# Patient Record
Sex: Female | Born: 1965 | Hispanic: No | Marital: Married | Smoking: Never smoker
Health system: Southern US, Community
[De-identification: ages and names within clinical notes are randomized; demographics above are authoritative.]

## PROBLEM LIST (undated history)

## (undated) DIAGNOSIS — D649 Anemia, unspecified: Secondary | ICD-10-CM

## (undated) DIAGNOSIS — F32A Depression, unspecified: Secondary | ICD-10-CM

## (undated) DIAGNOSIS — M1990 Unspecified osteoarthritis, unspecified site: Secondary | ICD-10-CM

## (undated) DIAGNOSIS — Z87442 Personal history of urinary calculi: Secondary | ICD-10-CM

## (undated) DIAGNOSIS — J45909 Unspecified asthma, uncomplicated: Secondary | ICD-10-CM

## (undated) DIAGNOSIS — R519 Headache, unspecified: Secondary | ICD-10-CM

## (undated) DIAGNOSIS — Z8719 Personal history of other diseases of the digestive system: Secondary | ICD-10-CM

## (undated) DIAGNOSIS — K219 Gastro-esophageal reflux disease without esophagitis: Secondary | ICD-10-CM

---
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
## 1994-06-01 HISTORY — PX: TUBAL LIGATION: SHX77

## 1999-06-02 HISTORY — PX: BUNIONECTOMY: SHX129

## 1999-06-02 HISTORY — PX: ABDOMINAL HYSTERECTOMY: SHX81

## 2004-06-01 HISTORY — PX: BUNIONECTOMY: SHX129

## 2008-06-01 HISTORY — PX: ROUX-EN-Y GASTRIC BYPASS: SHX1104

## 2013-11-30 ENCOUNTER — Other Ambulatory Visit: Payer: Self-pay | Admitting: Orthopedic Surgery

## 2013-11-30 DIAGNOSIS — M79604 Pain in right leg: Secondary | ICD-10-CM

## 2013-12-05 ENCOUNTER — Other Ambulatory Visit: Payer: Self-pay

## 2013-12-05 ENCOUNTER — Ambulatory Visit
Admission: RE | Admit: 2013-12-05 | Discharge: 2013-12-05 | Disposition: A | Discharge status: Home / Self Care | Payer: BC Managed Care – PPO | Source: Ambulatory Visit | Attending: Orthopedic Surgery | Admitting: Orthopedic Surgery

## 2013-12-05 DIAGNOSIS — M79604 Pain in right leg: Secondary | ICD-10-CM

## 2015-03-17 IMAGING — US US EXTREM LOW VENOUS*R*
1 series · 13 of 24 positions shown · non-contrast
Comparison: None.

CLINICAL DATA: Lower extremity pain

EXAM:
RIGHT LOWER EXTREMITY VENOUS DUPLEX ULTRASOUND
TECHNIQUE: Gray-scale sonography with graded compression, as well as color
Doppler and duplex ultrasound were performed to evaluate the lower
extremity deep venous systems from the level of the common femoral
vein and including the common femoral, femoral, profunda femoral,
popliteal and calf veins including the posterior tibial, peroneal
and gastrocnemius veins when visible. The superficial great
saphenous vein was also interrogated. Spectral Doppler was utilized
to evaluate flow at rest and with distal augmentation maneuvers in
the common femoral, femoral and popliteal veins.

[Series 1: us extrem low venous*right* · 13 of 34 slices shown]
[im 1/34]
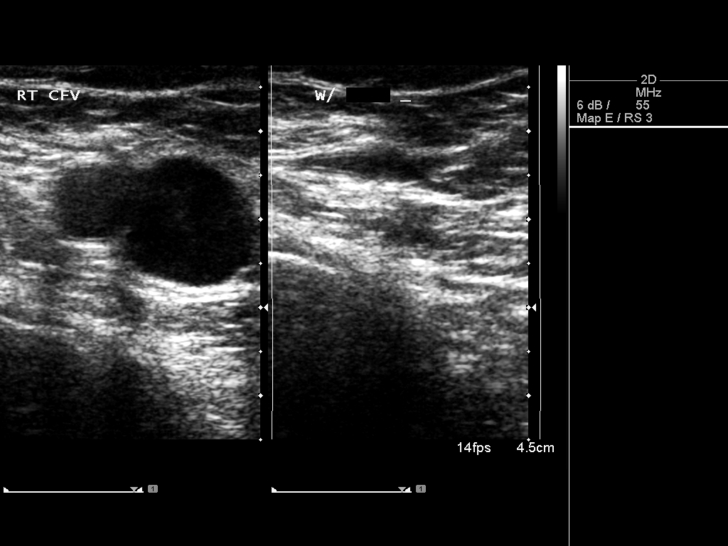
[im 3/34]
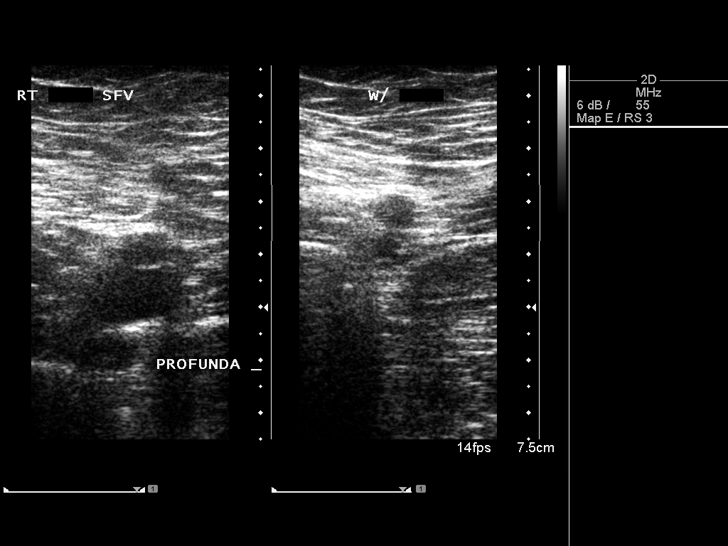
[im 6/34]
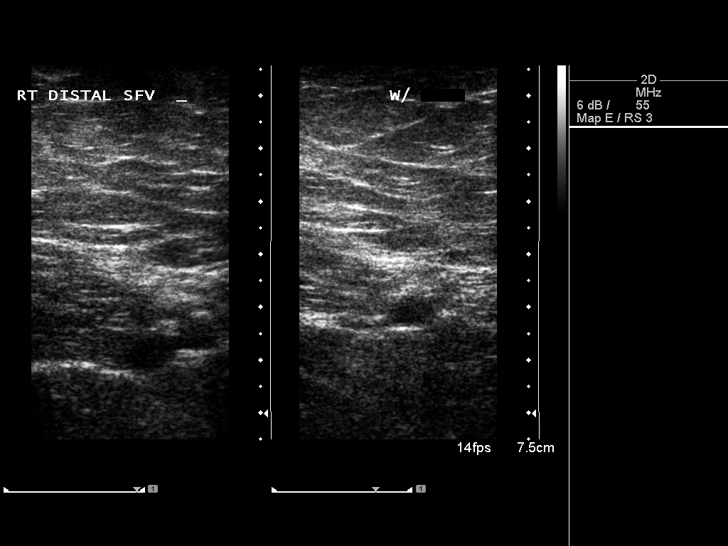
[im 9/34]
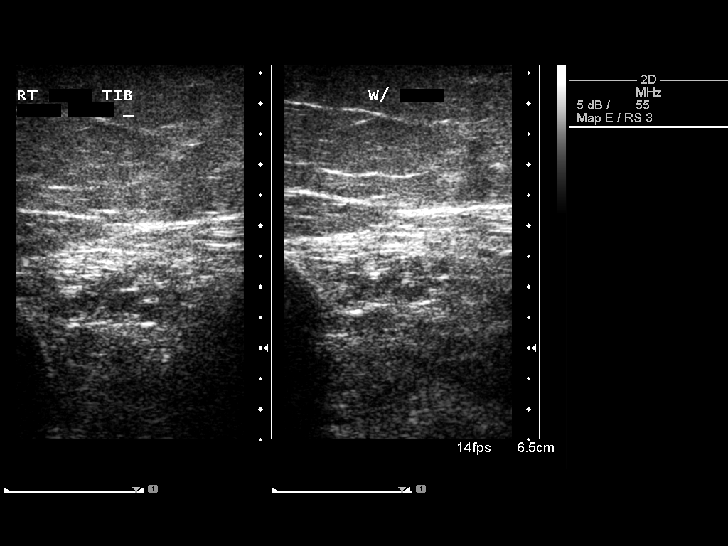
[im 12/34]
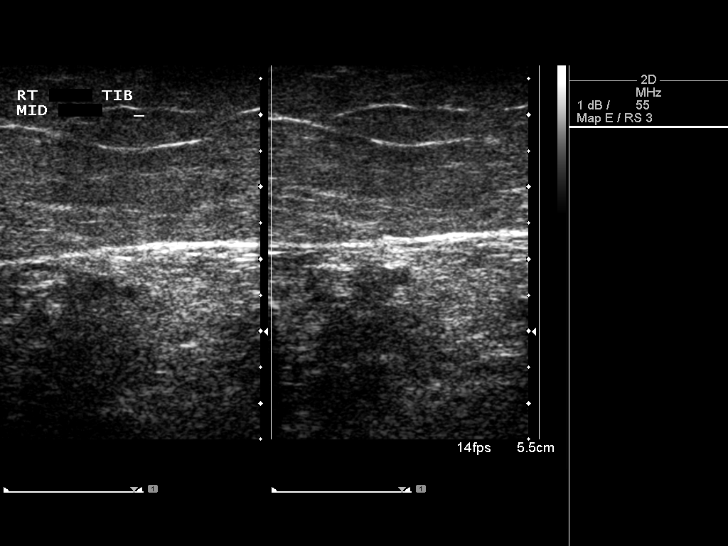
[im 15/34]
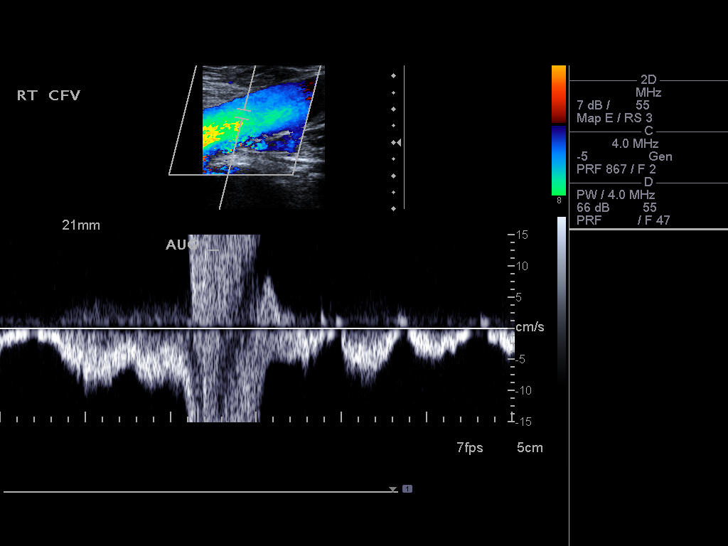
[im 18/34]
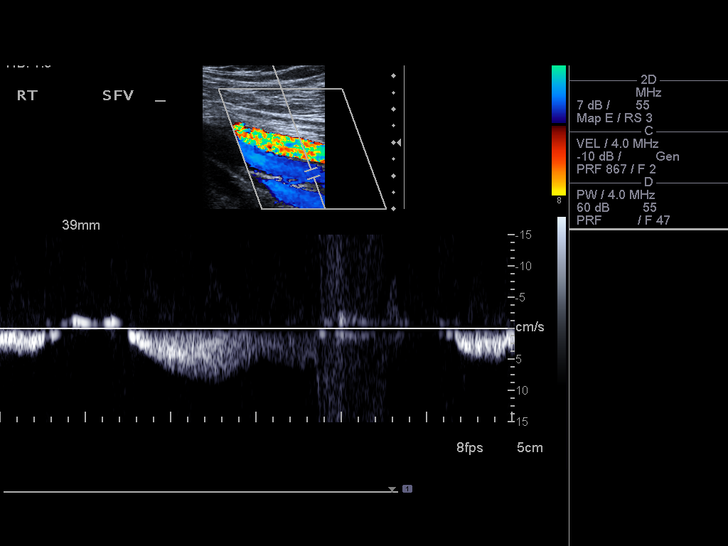
[im 19/34]
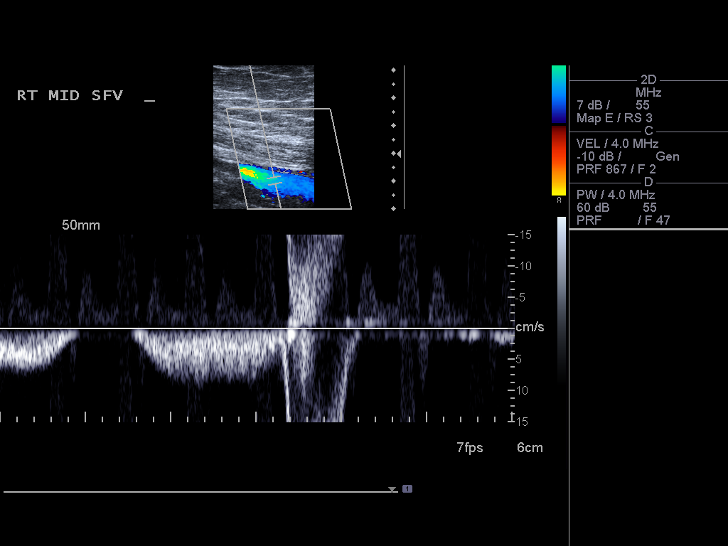
[im 22/34]
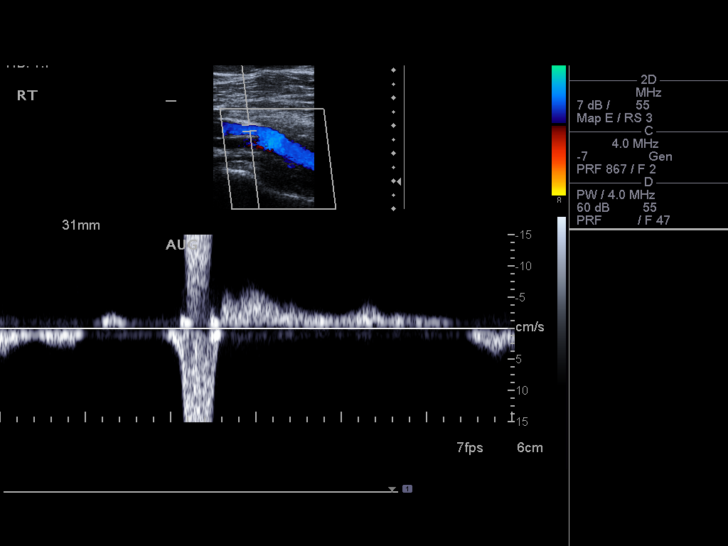
[im 25/34]
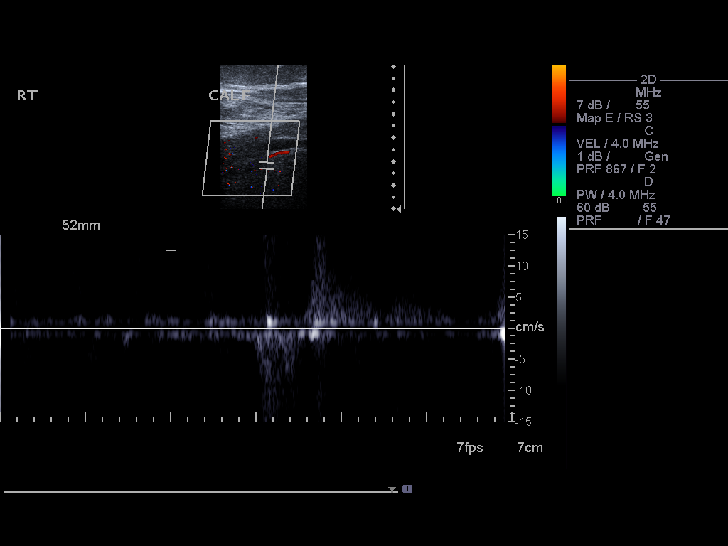
[im 28/34]
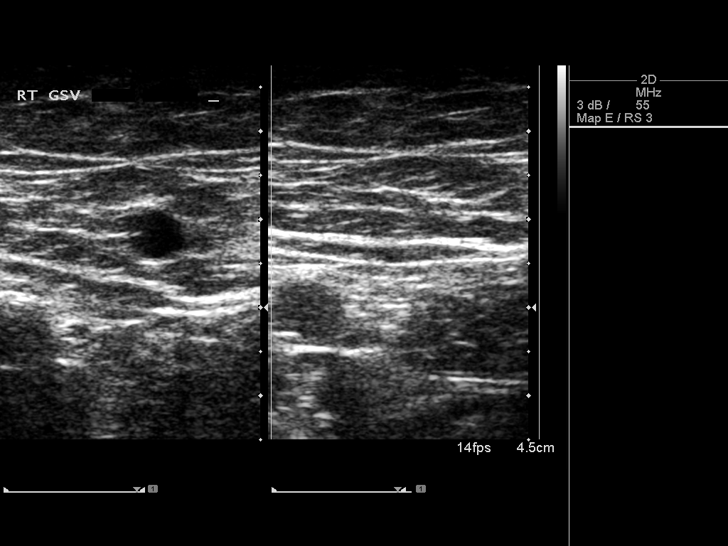
[im 31/34]
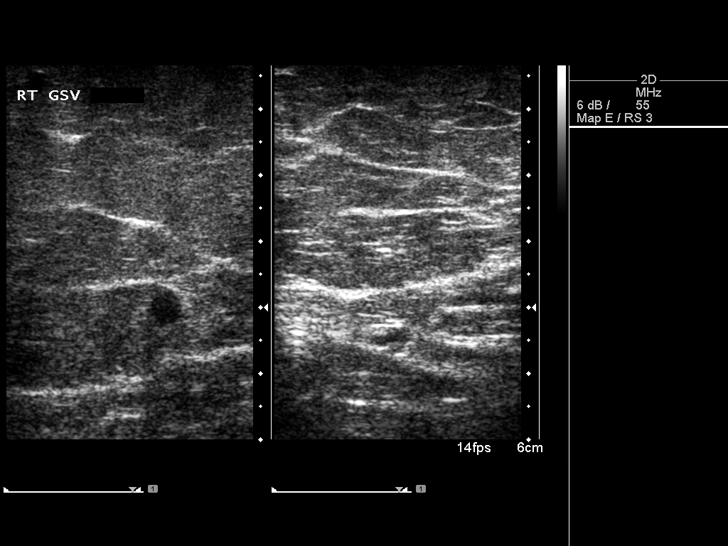
[im 34/34]
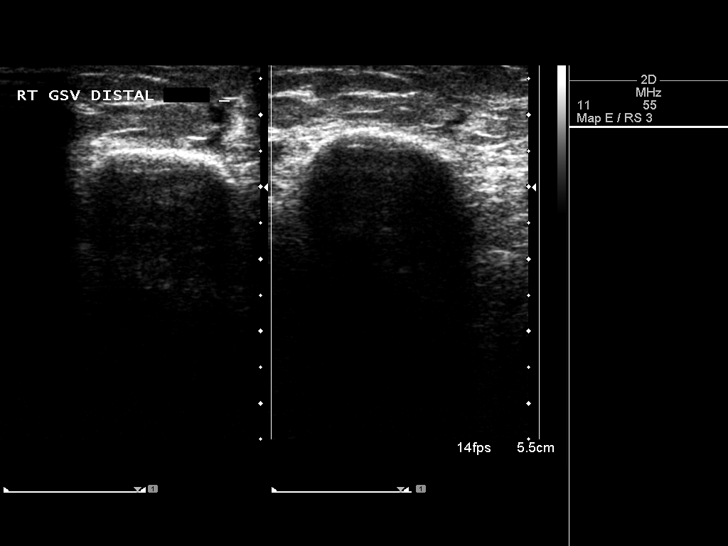

[13 of 24 positions shown; findings below may reference images not displayed]

FINDINGS: Flow in the venous structures of the right lower extremity is
spontaneous and phasic in all segments. There is normal compression
and augmentation in the venous structures of the right lower
extremity. Venous Doppler signal is normal in all regions. There is
no thrombus in the deep or visualized superficial venous structures
on the right. There is no deep venous incompetence on the right.
IMPRESSION: No evidence of right lower extremity deep venous thrombosis.

## 2019-06-02 HISTORY — PX: MENISCUS REPAIR: SHX5179

## 2019-10-02 ENCOUNTER — Encounter: Payer: Self-pay | Admitting: Gastroenterology

## 2019-10-24 ENCOUNTER — Encounter: Payer: Self-pay | Admitting: Gastroenterology

## 2019-11-10 ENCOUNTER — Encounter: Payer: Self-pay | Admitting: Gastroenterology

## 2023-02-04 ENCOUNTER — Other Ambulatory Visit: Payer: Self-pay | Admitting: Orthopedic Surgery

## 2023-02-12 NOTE — Progress Notes (Signed)
COVID Vaccine received:  []  No [x]  Yes Date of any COVID positive Test in last 90 days: no PCP -Stoney Bang PA Cardiologist - no  Chest x-ray - 02/15/23 Epic EKG -  02/15/23 Epic Stress Test - no ECHO - no Cardiac Cath - no  Bowel Prep - [x]  No  []   Yes ______  Pacemaker / ICD device [x]  No []  Yes   Spinal Cord Stimulator:[x]  No []  Yes       History of Sleep Apnea? [x]  No []  Yes   CPAP used?- [x]  No []  Yes    Does the patient monitor blood sugar?          [x]  No []  Yes  []  N/A  Patient has: [x]  NO Hx DM   []  Pre-DM                 []  DM1  []   DM2 Does patient have a [employer] or Dexacom? []  No []  Yes   Fasting Blood Sugar Ranges-  Checks Blood Sugar _____ times a day  GLP1 agonist / usual dose - no GLP1 instructions:  SGLT-2 inhibitors / usual dose - no SGLT-2 instructions:   Blood Thinner / Instructions:No Aspirin Instructions:Allergic  Comments:   Activity level: Patient is able to climb a flight of stairs without difficulty; [x]  No CP  [x]  No SOB, but would have _Knee pain__   Patient can perform ADLs without assistance.   Anesthesia review:   Patient denies shortness of breath, fever, cough and chest pain at PAT appointment.  Patient verbalized understanding and agreement to the Pre-Surgical Instructions that were given to them at this PAT appointment. Patient was also educated of the need to review these PAT instructions again prior to his/her surgery.I reviewed the appropriate phone numbers to call if they have any and questions or concerns.

## 2023-02-12 NOTE — Patient Instructions (Signed)
SURGICAL WAITING ROOM VISITATION  Patients having surgery or a procedure may have no more than 2 support people in the waiting area - these visitors may rotate.    Children under the age of 73 must have an adult with them who is not the patient.  Due to an increase in RSV and influenza rates and associated hospitalizations, children ages 88 and under may not visit patients in [facility] hospitals.  If the patient needs to stay at the hospital during part of their recovery, the visitor guidelines for inpatient rooms apply. Pre-op nurse will coordinate an appropriate time for 1 support person to accompany patient in pre-op.  This support person may not rotate.    Please refer to the [facility] website for the visitor guidelines for Inpatients (after your surgery is over and you are in a regular room).       Your procedure is scheduled on: 02/22/23   Report to [facility] Main Entrance    Report to admitting at  9:50 AM   Call this number if you have problems the morning of surgery [phone]   Do not eat food :After Midnight.   After Midnight you may have the following liquids until 9:20 AM DAY OF SURGERY  Water Non-Citrus Juices (without pulp, NO RED-Apple, White grape, White cranberry) Black Coffee (NO MILK/CREAM OR CREAMERS, sugar ok)  Clear Tea (NO MILK/CREAM OR CREAMERS, sugar ok) regular and decaf                             Plain Jell-O (NO RED)                                           Fruit ices (not with fruit pulp, NO RED)                                     Popsicles (NO RED)                                                               Sports drinks like Gatorade (NO RED)                  The day of surgery:  Drink ONE (1) Pre-Surgery Clear Ensure at 9:20 AM the morning of surgery. Drink in one sitting. Do not sip.  This drink was given to you during your hospital  pre-op appointment visit. Nothing else to drink after completing the  Pre-Surgery Clear  Ensure      Oral Hygiene is also important to reduce your risk of infection.                                    Remember - BRUSH YOUR TEETH THE MORNING OF SURGERY WITH YOUR REGULAR TOOTHPASTE   Stop all vitamins and herbal supplements 7 days before surgery.   Take these medicines the morning of surgery : Loratidine, tylenol if needed.  You may not have any metal on your body including hair pins, jewelry, and body piercing             Do not wear make-up, lotions, powders, perfumes or deodorant  Do not wear nail polish including gel and S&S, artificial/acrylic nails, or any other type of covering on natural nails including finger and toenails. If you have artificial nails, gel coating, etc. that needs to be removed by a nail salon please have this removed prior to surgery or surgery may need to be canceled/ delayed if the surgeon/ anesthesia feels like they are unable to be safely monitored.   Do not shave  48 hours prior to surgery.    Do not bring valuables to the hospital. [facility] IS NOT             RESPONSIBLE   FOR VALUABLES.   Contacts, glasses, dentures or bridgework may not be worn into surgery.  DO NOT BRING YOUR HOME MEDICATIONS TO THE HOSPITAL. PHARMACY WILL DISPENSE MEDICATIONS LISTED ON YOUR MEDICATION LIST TO YOU DURING YOUR ADMISSION IN THE HOSPITAL!    Patients discharged on the day of surgery will not be allowed to drive home.  Someone NEEDS to stay with you for the first 24 hours after anesthesia.   Special Instructions: Bring a copy of your healthcare power of attorney and living will documents the day of surgery if you haven't scanned them before.              Please read over the following fact sheets you were given: IF YOU HAVE QUESTIONS ABOUT YOUR PRE-OP INSTRUCTIONS PLEASE CALL [phone] Rosey [facility]   If you received a COVID test during your pre-op visit  it is requested that you wear a mask when out in public, stay away from anyone that may not be  feeling well and notify your surgeon if you develop symptoms. If you test positive for Covid or have been in contact with anyone that has tested positive in the last 10 days please notify you surgeon.      Pre-operative 5 CHG Bath Instructions   You can play a key role in reducing the risk of infection after surgery. Your skin needs to be as free of germs as possible. You can reduce the number of germs on your skin by washing with CHG (chlorhexidine gluconate) soap before surgery. CHG is an antiseptic soap that kills germs and continues to kill germs even after washing.   DO NOT use if you have an allergy to chlorhexidine/CHG or antibacterial soaps. If your skin becomes reddened or irritated, stop using the CHG and notify one of our RNs at [phone].   Please shower with the CHG soap starting 4 days before surgery using the following schedule:     Please keep in mind the following:  DO NOT shave, including legs and underarms, starting the day of your first shower.   You may shave your face at any point before/day of surgery.  Place clean sheets on your bed the day you start using CHG soap. Use a clean washcloth (not used since being washed) for each shower. DO NOT sleep with pets once you start using the CHG.   CHG Shower Instructions:  If you choose to wash your hair and private area, wash first with your normal shampoo/soap.  After you use shampoo/soap, rinse your hair and body thoroughly to remove shampoo/soap residue.  Turn the water OFF and apply about 3 tablespoons (45 ml) of  CHG soap to a CLEAN washcloth.  Apply CHG soap ONLY FROM YOUR NECK DOWN TO YOUR TOES (washing for 3-5 minutes)  DO NOT use CHG soap on face, private areas, open wounds, or sores.  Pay special attention to the area where your surgery is being performed.  If you are having back surgery, having someone wash your back for you may be helpful. Wait 2 minutes after CHG soap is applied, then you may rinse off the  CHG soap.  Pat dry with a clean towel  Put on clean clothes/pajamas   If you choose to wear lotion, please use ONLY the CHG-compatible lotions on the back of this paper.     Additional instructions for the day of surgery: DO NOT APPLY any lotions, deodorants, cologne, or perfumes.   Put on clean/comfortable clothes.  Brush your teeth.  Ask your nurse before applying any prescription medications to the skin.      CHG Compatible Lotions   Aveeno Moisturizing lotion  Cetaphil Moisturizing Cream  Cetaphil Moisturizing Lotion  Clairol Herbal Essence Moisturizing Lotion, Dry Skin  Clairol Herbal Essence Moisturizing Lotion, Extra Dry Skin  Clairol Herbal Essence Moisturizing Lotion, Normal Skin  Curel Age Defying Therapeutic Moisturizing Lotion with Alpha Hydroxy  Curel Extreme Care Body Lotion  Curel Soothing Hands Moisturizing Hand Lotion  Curel Therapeutic Moisturizing Cream, Fragrance-Free  Curel Therapeutic Moisturizing Lotion, Fragrance-Free  Curel Therapeutic Moisturizing Lotion, Original Formula  Eucerin Daily Replenishing Lotion  Eucerin Dry Skin Therapy Plus Alpha Hydroxy Crme  Eucerin Dry Skin Therapy Plus Alpha Hydroxy Lotion  Eucerin Original Crme  Eucerin Original Lotion  Eucerin Plus Crme Eucerin Plus Lotion  Eucerin TriLipid Replenishing Lotion  Keri Anti-Bacterial Hand Lotion  Keri Deep Conditioning Original Lotion Dry Skin Formula Softly Scented  Keri Deep Conditioning Original Lotion, Fragrance Free Sensitive Skin Formula  Keri Lotion Fast Absorbing Fragrance Free Sensitive Skin Formula  Keri Lotion Fast Absorbing Softly Scented Dry Skin Formula  Keri Original Lotion  Keri Skin Renewal Lotion Keri Silky Smooth Lotion  Keri Silky Smooth Sensitive Skin Lotion  Nivea Body Creamy Conditioning Oil  Nivea Body Extra Enriched Lotion  Nivea Body Original Lotion  Nivea Body Sheer Moisturizing Lotion Nivea Crme  Nivea Skin Firming Lotion  NutraDerm 30 Skin  Lotion  NutraDerm Skin Lotion  NutraDerm Therapeutic Skin Cream  NutraDerm Therapeutic Skin Lotion  ProShield Protective Hand Cream    Incentive Spirometer (Watch this video at home: [email or web address])  An incentive spirometer is a tool that can help keep your lungs clear and active. This tool measures how well you are filling your lungs with each breath. Taking long deep breaths may help reverse or decrease the chance of developing breathing (pulmonary) problems (especially infection) following: A long period of time when you are unable to move or be active. BEFORE THE PROCEDURE  If the spirometer includes an indicator to show your best effort, your nurse or respiratory therapist will set it to a desired goal. If possible, sit up straight or lean slightly forward. Try not to slouch. Hold the incentive spirometer in an upright position. INSTRUCTIONS FOR USE  Sit on the edge of your bed if possible, or sit up as far as you can in bed or on a chair. Hold the incentive spirometer in an upright position. Breathe out normally. Place the mouthpiece in your mouth and seal your lips tightly around it. Breathe in slowly and as deeply as possible, raising the piston or the ball toward the top of  the column. Hold your breath for 3-5 seconds or for as long as possible. Allow the piston or ball to fall to the bottom of the column. Remove the mouthpiece from your mouth and breathe out normally. Rest for a few seconds and repeat Steps 1 through 7 at least 10 times every 1-2 hours when you are awake. Take your time and take a few normal breaths between deep breaths. The spirometer may include an indicator to show your best effort. Use the indicator as a goal to work toward during each repetition. After each set of 10 deep breaths, practice coughing to be sure your lungs are clear. If you have an incision (the cut made at the time of surgery), support your incision when coughing by  placing a pillow or rolled up towels firmly against it. Once you are able to get out of bed, walk around indoors and cough well. You may stop using the incentive spirometer when instructed by your caregiver.  RISKS AND COMPLICATIONS Take your time so you do not get dizzy or light-headed. If you are in pain, you may need to take or ask for pain medication before doing incentive spirometry. It is harder to take a deep breath if you are having pain. AFTER USE Rest and breathe slowly and easily. It can be helpful to keep track of a log of your progress. Your caregiver can provide you with a simple table to help with this. If you are using the spirometer at home, follow these instructions: SEEK MEDICAL CARE IF:  You are having difficultly using the spirometer. You have trouble using the spirometer as often as instructed. Your pain medication is not giving enough relief while using the spirometer. You develop fever of 100.5 F (38.1 C) or higher. SEEK IMMEDIATE MEDICAL CARE IF:  You cough up bloody sputum that had not been present before. You develop fever of 102 F (38.9 C) or greater. You develop worsening pain at or near the incision site. MAKE SURE YOU:  Understand these instructions. Will watch your condition. Will get help right away if you are not doing well or get worse. Document Released: 09/28/2006 Document Revised: 08/10/2011 Document Reviewed: 11/29/2006 [facility] Patient Information 2014 [place], [place].

## 2023-02-15 ENCOUNTER — Encounter (HOSPITAL_COMMUNITY)
Admission: RE | Admit: 2023-02-15 | Discharge: 2023-02-15 | Disposition: A | Discharge status: Home / Self Care | Payer: No Typology Code available for payment source | Source: Ambulatory Visit | Attending: Orthopedic Surgery | Admitting: Orthopedic Surgery

## 2023-02-15 ENCOUNTER — Other Ambulatory Visit: Payer: Self-pay

## 2023-02-15 ENCOUNTER — Encounter (HOSPITAL_COMMUNITY): Payer: Self-pay | Admitting: *Deleted

## 2023-02-15 ENCOUNTER — Ambulatory Visit (HOSPITAL_COMMUNITY)
Admission: RE | Admit: 2023-02-15 | Discharge: 2023-02-15 | Disposition: A | Discharge status: Home / Self Care | Payer: No Typology Code available for payment source | Source: Ambulatory Visit | Attending: Orthopedic Surgery | Admitting: Orthopedic Surgery

## 2023-02-15 VITALS — HR 79 | Temp 98.1°F | Resp 16 | Ht 61.5 in | Wt 192.0 lb

## 2023-02-15 DIAGNOSIS — D649 Anemia, unspecified: Secondary | ICD-10-CM | POA: Diagnosis not present

## 2023-02-15 DIAGNOSIS — Z01818 Encounter for other preprocedural examination: Secondary | ICD-10-CM | POA: Insufficient documentation

## 2023-02-15 HISTORY — DX: Depression, unspecified: F32.A

## 2023-02-15 HISTORY — DX: Personal history of other diseases of the digestive system: Z87.19

## 2023-02-15 HISTORY — DX: Personal history of urinary calculi: Z87.442

## 2023-02-15 HISTORY — DX: Gastro-esophageal reflux disease without esophagitis: K21.9

## 2023-02-15 HISTORY — DX: Anemia, unspecified: D64.9

## 2023-02-15 HISTORY — DX: Unspecified asthma, uncomplicated: J45.909

## 2023-02-15 HISTORY — DX: Unspecified osteoarthritis, unspecified site: M19.90

## 2023-02-15 HISTORY — DX: Headache, unspecified: R51.9

## 2023-02-15 LAB — CBC
HCT: 42.5 % (ref 36.0–46.0)
Hemoglobin: 13 g/dL (ref 12.0–15.0)
MCH: 26.7 pg (ref 26.0–34.0)
MCHC: 30.6 g/dL (ref 30.0–36.0)
MCV: 87.3 fL (ref 80.0–100.0)
Platelets: 261 10*3/uL (ref 150–400)
RBC: 4.87 MIL/uL (ref 3.87–5.11)
RDW: 14.7 % (ref 11.5–15.5)
WBC: 8.1 10*3/uL (ref 4.0–10.5)
nRBC: 0 % (ref 0.0–0.2)

## 2023-02-15 LAB — BASIC METABOLIC PANEL
Anion gap: 9 (ref 5–15)
BUN: 11 mg/dL (ref 6–20)
CO2: 26 mmol/L (ref 22–32)
Calcium: 9.1 mg/dL (ref 8.9–10.3)
Chloride: 103 mmol/L (ref 98–111)
Creatinine, Ser: 0.57 mg/dL (ref 0.44–1.00)
GFR, Estimated: >60 mL/min (ref >60)
Glucose, Bld: 92 mg/dL (ref 70–99)
Potassium: 4.1 mmol/L (ref 3.5–5.1)
Sodium: 138 mmol/L (ref 135–145)

## 2023-02-16 LAB — SURGICAL PCR SCREEN

## 2023-02-22 ENCOUNTER — Encounter (HOSPITAL_COMMUNITY)
Admission: RE | Disposition: A | Discharge status: Home / Self Care | Payer: Self-pay | Source: Home / Self Care | Attending: Orthopedic Surgery

## 2023-02-22 ENCOUNTER — Encounter (HOSPITAL_COMMUNITY): Payer: Self-pay | Admitting: Orthopedic Surgery

## 2023-02-22 ENCOUNTER — Ambulatory Visit (HOSPITAL_COMMUNITY)
Admission: RE | Admit: 2023-02-22 | Discharge: 2023-02-22 | Disposition: A | Discharge status: Home / Self Care | Payer: No Typology Code available for payment source | Attending: Orthopedic Surgery | Admitting: Orthopedic Surgery

## 2023-02-22 ENCOUNTER — Ambulatory Visit (HOSPITAL_COMMUNITY): Payer: No Typology Code available for payment source | Admitting: Anesthesiology

## 2023-02-22 ENCOUNTER — Other Ambulatory Visit: Payer: Self-pay

## 2023-02-22 DIAGNOSIS — M25761 Osteophyte, right knee: Secondary | ICD-10-CM | POA: Diagnosis not present

## 2023-02-22 DIAGNOSIS — M1711 Unilateral primary osteoarthritis, right knee: Secondary | ICD-10-CM | POA: Insufficient documentation

## 2023-02-22 DIAGNOSIS — Z01818 Encounter for other preprocedural examination: Secondary | ICD-10-CM

## 2023-02-22 HISTORY — PX: TOTAL KNEE ARTHROPLASTY: SHX125

## 2023-02-22 LAB — SURGICAL PCR SCREEN
MRSA, PCR: NEGATIVE
Staphylococcus aureus: NEGATIVE

## 2023-02-22 SURGERY — ARTHROPLASTY, KNEE, TOTAL
Anesthesia: Spinal | Site: Knee | Laterality: Right

## 2023-02-22 MED ORDER — BUPIVACAINE-EPINEPHRINE (PF) 0.5% -1:200000 IJ SOLN
INTRAMUSCULAR | Status: AC
  Filled 2023-02-22: qty 30

## 2023-02-22 MED ORDER — LIDOCAINE HCL (PF) 2 % IJ SOLN
INTRAMUSCULAR | Status: AC
  Filled 2023-02-22: qty 10

## 2023-02-22 MED ORDER — CEFAZOLIN SODIUM-DEXTROSE 2-4 GM/100ML-% IV SOLN: 2.0000 g | Freq: Once | INTRAVENOUS | Status: DC

## 2023-02-22 MED ORDER — MIDAZOLAM HCL 2 MG/2ML IJ SOLN
1.0000 mg | INTRAMUSCULAR | Status: DC
  Administered 2023-02-22: 1 mg via INTRAVENOUS
  Filled 2023-02-22: qty 2

## 2023-02-22 MED ORDER — BUPIVACAINE LIPOSOME 1.3 % IJ SUSP
INTRAMUSCULAR | Status: AC
  Filled 2023-02-22: qty 20

## 2023-02-22 MED ORDER — SODIUM CHLORIDE (PF) 0.9 % IJ SOLN
INTRAMUSCULAR | Status: AC
  Filled 2023-02-22: qty 50

## 2023-02-22 MED ORDER — OXYCODONE HCL 5 MG PO TABS: 5.0000 mg | ORAL_TABLET | ORAL | Status: DC | PRN

## 2023-02-22 MED ORDER — BUPIVACAINE IN DEXTROSE 0.75-8.25 % IT SOLN
INTRATHECAL | Status: DC | PRN
Start: 2023-02-22 — End: 2023-02-22
  Administered 2023-02-22: 1.6 mL via INTRATHECAL

## 2023-02-22 MED ORDER — OXYCODONE HCL 5 MG PO TABS: 5.0000 mg | ORAL_TABLET | Freq: Once | ORAL | Status: DC | PRN

## 2023-02-22 MED ORDER — ONDANSETRON HCL 4 MG/2ML IJ SOLN
INTRAMUSCULAR | Status: AC
  Filled 2023-02-22: qty 2

## 2023-02-22 MED ORDER — TRANEXAMIC ACID-NACL 1000-0.7 MG/100ML-% IV SOLN: 1000.0000 mg | Freq: Once | INTRAVENOUS | Status: DC

## 2023-02-22 MED ORDER — WATER FOR IRRIGATION, STERILE IR SOLN
Status: DC | PRN
  Administered 2023-02-22: 2000 mL

## 2023-02-22 MED ORDER — GLYCOPYRROLATE 0.2 MG/ML IJ SOLN
INTRAMUSCULAR | Status: AC
  Filled 2023-02-22: qty 1

## 2023-02-22 MED ORDER — DROPERIDOL 2.5 MG/ML IJ SOLN: 0.6250 mg | Freq: Once | INTRAMUSCULAR | Status: DC | PRN

## 2023-02-22 MED ORDER — PROPOFOL 500 MG/50ML IV EMUL
INTRAVENOUS | Status: DC | PRN
  Administered 2023-02-22: 125 ug/kg/min via INTRAVENOUS

## 2023-02-22 MED ORDER — SODIUM CHLORIDE 0.9 % IR SOLN
Status: DC | PRN
  Administered 2023-02-22: 3000 mL

## 2023-02-22 MED ORDER — PHENYLEPHRINE 80 MCG/ML (10ML) SYRINGE FOR IV PUSH (FOR BLOOD PRESSURE SUPPORT)
PREFILLED_SYRINGE | INTRAVENOUS | Status: AC
  Filled 2023-02-22: qty 30

## 2023-02-22 MED ORDER — LACTATED RINGERS IV SOLN: INTRAVENOUS | Status: DC | PRN

## 2023-02-22 MED ORDER — ACETAMINOPHEN 500 MG PO TABS: 1000.0000 mg | ORAL_TABLET | Freq: Once | ORAL | Status: DC

## 2023-02-22 MED ORDER — LACTATED RINGERS IV SOLN: INTRAVENOUS | Status: DC

## 2023-02-22 MED ORDER — LACTATED RINGERS IV BOLUS
500.0000 mL | Freq: Once | INTRAVENOUS | Status: AC
  Administered 2023-02-22: 500 mL via INTRAVENOUS

## 2023-02-22 MED ORDER — BUPIVACAINE LIPOSOME 1.3 % IJ SUSP: 20.0000 mL | Freq: Once | INTRAMUSCULAR | Status: DC

## 2023-02-22 MED ORDER — LIDOCAINE HCL (PF) 2 % IJ SOLN
INTRAMUSCULAR | Status: AC
  Filled 2023-02-22: qty 5

## 2023-02-22 MED ORDER — DEXAMETHASONE SODIUM PHOSPHATE 10 MG/ML IJ SOLN
INTRAMUSCULAR | Status: AC
  Filled 2023-02-22: qty 1

## 2023-02-22 MED ORDER — TIZANIDINE HCL 2 MG PO TABS: 2.0000 mg | ORAL_TABLET | Freq: Three times a day (TID) | ORAL | 0 refills | Status: AC | PRN

## 2023-02-22 MED ORDER — TRANEXAMIC ACID-NACL 1000-0.7 MG/100ML-% IV SOLN
1000.0000 mg | INTRAVENOUS | Status: AC
  Administered 2023-02-22: 1000 mg via INTRAVENOUS
  Filled 2023-02-22: qty 100

## 2023-02-22 MED ORDER — PROPOFOL 1000 MG/100ML IV EMUL
INTRAVENOUS | Status: AC
  Filled 2023-02-22: qty 100

## 2023-02-22 MED ORDER — OXYCODONE-ACETAMINOPHEN 5-325 MG PO TABS
1.0000 | ORAL_TABLET | Freq: Four times a day (QID) | ORAL | 0 refills | Status: AC | PRN
Start: 2023-02-22 — End: ?

## 2023-02-22 MED ORDER — 0.9 % SODIUM CHLORIDE (POUR BTL) OPTIME
TOPICAL | Status: DC | PRN
  Administered 2023-02-22: 1000 mL

## 2023-02-22 MED ORDER — ONDANSETRON HCL 4 MG/2ML IJ SOLN: 4.0000 mg | Freq: Four times a day (QID) | INTRAMUSCULAR | Status: DC | PRN

## 2023-02-22 MED ORDER — CHLORHEXIDINE GLUCONATE 0.12 % MT SOLN
15.0000 mL | Freq: Once | OROMUCOSAL | Status: AC
  Administered 2023-02-22: 15 mL via OROMUCOSAL

## 2023-02-22 MED ORDER — METHOCARBAMOL 500 MG PO TABS: 500.0000 mg | ORAL_TABLET | Freq: Four times a day (QID) | ORAL | Status: DC | PRN

## 2023-02-22 MED ORDER — PROMETHAZINE HCL 25 MG/ML IJ SOLN: 6.2500 mg | INTRAMUSCULAR | Status: DC | PRN

## 2023-02-22 MED ORDER — CEFAZOLIN SODIUM-DEXTROSE 2-4 GM/100ML-% IV SOLN
2.0000 g | INTRAVENOUS | Status: AC
  Administered 2023-02-22: 2 g via INTRAVENOUS
  Filled 2023-02-22: qty 100

## 2023-02-22 MED ORDER — PROPOFOL 10 MG/ML IV BOLUS
INTRAVENOUS | Status: AC
  Filled 2023-02-22: qty 20

## 2023-02-22 MED ORDER — ROPIVACAINE HCL 5 MG/ML IJ SOLN
INTRAMUSCULAR | Status: DC | PRN
  Administered 2023-02-22: 30 mL via PERINEURAL

## 2023-02-22 MED ORDER — ORAL CARE MOUTH RINSE: 15.0000 mL | Freq: Once | OROMUCOSAL | Status: AC

## 2023-02-22 MED ORDER — POVIDONE-IODINE 10 % EX SWAB: 2.0000 | Freq: Once | CUTANEOUS | Status: DC

## 2023-02-22 MED ORDER — BUPIVACAINE LIPOSOME 1.3 % IJ SUSP
INTRAMUSCULAR | Status: DC | PRN
  Administered 2023-02-22: 20 mL

## 2023-02-22 MED ORDER — ACETAMINOPHEN 500 MG PO TABS: 1000.0000 mg | ORAL_TABLET | Freq: Four times a day (QID) | ORAL | Status: DC

## 2023-02-22 MED ORDER — SODIUM CHLORIDE 0.9% FLUSH
INTRAVENOUS | Status: DC | PRN
  Administered 2023-02-22: 50 mL

## 2023-02-22 MED ORDER — BUPIVACAINE-EPINEPHRINE 0.5% -1:200000 IJ SOLN
INTRAMUSCULAR | Status: DC | PRN
  Administered 2023-02-22: 30 mL

## 2023-02-22 MED ORDER — DEXAMETHASONE SODIUM PHOSPHATE 10 MG/ML IJ SOLN
INTRAMUSCULAR | Status: DC | PRN
  Administered 2023-02-22: 10 mg via INTRAVENOUS

## 2023-02-22 MED ORDER — HYDROMORPHONE HCL 1 MG/ML IJ SOLN: 0.5000 mg | INTRAMUSCULAR | Status: DC | PRN

## 2023-02-22 MED ORDER — PROPOFOL 10 MG/ML IV BOLUS
INTRAVENOUS | Status: DC | PRN
Start: 2023-02-22 — End: 2023-02-22
  Administered 2023-02-22: 40 mg via INTRAVENOUS

## 2023-02-22 MED ORDER — DOCUSATE SODIUM 100 MG PO CAPS: 100.0000 mg | ORAL_CAPSULE | Freq: Two times a day (BID) | ORAL | 0 refills | Status: AC

## 2023-02-22 MED ORDER — ONDANSETRON HCL 4 MG PO TABS
4.0000 mg | ORAL_TABLET | Freq: Four times a day (QID) | ORAL | Status: DC | PRN
  Filled 2023-02-22: qty 1

## 2023-02-22 MED ORDER — ONDANSETRON HCL 4 MG/2ML IJ SOLN
INTRAMUSCULAR | Status: DC | PRN
  Administered 2023-02-22: 4 mg via INTRAVENOUS

## 2023-02-22 MED ORDER — CLONIDINE HCL (ANALGESIA) 100 MCG/ML EP SOLN
EPIDURAL | Status: DC | PRN
Start: 2023-02-22 — End: 2023-02-22
  Administered 2023-02-22: 80 ug

## 2023-02-22 MED ORDER — FENTANYL CITRATE PF 50 MCG/ML IJ SOSY
50.0000 ug | PREFILLED_SYRINGE | INTRAMUSCULAR | Status: AC
  Administered 2023-02-22: 50 ug via INTRAVENOUS
  Filled 2023-02-22: qty 2

## 2023-02-22 MED ORDER — MEPERIDINE HCL 50 MG/ML IJ SOLN: 6.2500 mg | INTRAMUSCULAR | Status: DC | PRN

## 2023-02-22 MED ORDER — LACTATED RINGERS IV BOLUS: 250.0000 mL | Freq: Once | INTRAVENOUS | Status: DC

## 2023-02-22 MED ORDER — ASPIRIN 81 MG PO TBEC: 81.0000 mg | DELAYED_RELEASE_TABLET | Freq: Two times a day (BID) | ORAL | 0 refills | Status: AC

## 2023-02-22 MED ORDER — OXYCODONE HCL 5 MG/5ML PO SOLN: 5.0000 mg | Freq: Once | ORAL | Status: DC | PRN

## 2023-02-22 MED ORDER — HYDROMORPHONE HCL 1 MG/ML IJ SOLN: 0.2500 mg | INTRAMUSCULAR | Status: DC | PRN

## 2023-02-22 MED ORDER — DEXAMETHASONE SODIUM PHOSPHATE 4 MG/ML IJ SOLN
INTRAMUSCULAR | Status: DC | PRN
Start: 2023-02-22 — End: 2023-02-22
  Administered 2023-02-22: 5 mg via PERINEURAL

## 2023-02-22 MED ORDER — PHENYLEPHRINE HCL (PRESSORS) 10 MG/ML IV SOLN
INTRAVENOUS | Status: DC | PRN
Start: 2023-02-22 — End: 2023-02-22
  Administered 2023-02-22 (×3): 80 ug via INTRAVENOUS

## 2023-02-22 MED ORDER — FENTANYL CITRATE (PF) 100 MCG/2ML IJ SOLN
INTRAMUSCULAR | Status: AC
  Filled 2023-02-22: qty 2

## 2023-02-22 MED ORDER — METHOCARBAMOL 500 MG IVPB - SIMPLE MED: 500.0000 mg | Freq: Four times a day (QID) | INTRAVENOUS | Status: DC | PRN

## 2023-02-22 MED ORDER — MIDAZOLAM HCL 2 MG/2ML IJ SOLN
INTRAMUSCULAR | Status: AC
  Filled 2023-02-22: qty 2

## 2023-02-22 SURGICAL SUPPLY — 59 items
APL SKNCLS STERI-STRIP NONHPOA (GAUZE/BANDAGES/DRESSINGS)
ATTUNE MED DOME PAT 38 KNEE (Knees) IMPLANT
ATTUNE PSFEM RTSZ4 NARCEM KNEE (Femur) IMPLANT
ATTUNE PSRP INSR SZ4 8 KNEE (Insert) IMPLANT
BAG COUNTER SPONGE SURGICOUNT (BAG) IMPLANT
BAG SPEC THK2 15X12 ZIP CLS (MISCELLANEOUS) ×1
BAG SPNG CNTER NS LX DISP (BAG)
BAG ZIPLOCK 12X15 (MISCELLANEOUS) ×1 IMPLANT
BASEPLATE TIBIAL ROTATING SZ 4 (Knees) IMPLANT
BENZOIN TINCTURE PRP APPL 2/3 (GAUZE/BANDAGES/DRESSINGS) ×1 IMPLANT
BLADE SAGITTAL 25.0X1.19X90 (BLADE) ×1 IMPLANT
BLADE SAW SGTL 13.0X1.19X90.0M (BLADE) ×1 IMPLANT
BLADE SURG SZ10 CARB STEEL (BLADE) ×2 IMPLANT
BNDG CMPR 6 X 5 YARDS HK CLSR (GAUZE/BANDAGES/DRESSINGS) ×1
BNDG ELASTIC 6INX 5YD STR LF (GAUZE/BANDAGES/DRESSINGS) ×1 IMPLANT
BOOTIES KNEE HIGH SLOAN (MISCELLANEOUS) ×1 IMPLANT
BOWL SMART MIX CTS (DISPOSABLE) ×1 IMPLANT
BSPLAT TIB 4 CMNT ROT PLAT STR (Knees) ×1 IMPLANT
CEMENT HV SMART SET (Cement) ×2 IMPLANT
COVER SURGICAL LIGHT HANDLE (MISCELLANEOUS) ×1 IMPLANT
CUFF TOURN SGL QUICK 34 (TOURNIQUET CUFF) ×1
CUFF TRNQT CYL 34X4.125X (TOURNIQUET CUFF) ×1 IMPLANT
DRAPE INCISE IOBAN 66X45 STRL (DRAPES) ×1 IMPLANT
DRAPE U-SHAPE 47X51 STRL (DRAPES) ×1 IMPLANT
DRSG AQUACEL AG ADV 3.5X10 (GAUZE/BANDAGES/DRESSINGS) ×1 IMPLANT
DURAPREP 26ML APPLICATOR (WOUND CARE) ×1 IMPLANT
ELECT REM PT RETURN 15FT ADLT (MISCELLANEOUS) ×1 IMPLANT
GLOVE BIOGEL PI IND STRL 8 (GLOVE) ×2 IMPLANT
GLOVE ECLIPSE 7.5 STRL STRAW (GLOVE) ×2 IMPLANT
GOWN STRL REUS W/ TWL XL LVL3 (GOWN DISPOSABLE) ×2 IMPLANT
GOWN STRL REUS W/TWL XL LVL3 (GOWN DISPOSABLE) ×2
HANDPIECE INTERPULSE COAX TIP (DISPOSABLE) ×1
HOLDER FOLEY CATH W/STRAP (MISCELLANEOUS) IMPLANT
HOOD PEEL AWAY T7 (MISCELLANEOUS) ×3 IMPLANT
IMMOBILIZER KNEE 20 (SOFTGOODS) ×1
IMMOBILIZER KNEE 20 THIGH 36 (SOFTGOODS) IMPLANT
IV NS IRRIG 3000ML ARTHROMATIC (IV SOLUTION) IMPLANT
KIT TURNOVER KIT A (KITS) IMPLANT
MANIFOLD NEPTUNE II (INSTRUMENTS) ×1 IMPLANT
NDL HYPO 22X1.5 SAFETY MO (MISCELLANEOUS) ×2 IMPLANT
NEEDLE HYPO 22X1.5 SAFETY MO (MISCELLANEOUS) ×2 IMPLANT
NS IRRIG 1000ML POUR BTL (IV SOLUTION) ×1 IMPLANT
PACK TOTAL KNEE CUSTOM (KITS) ×1 IMPLANT
PADDING CAST COTTON 6X4 STRL (CAST SUPPLIES) ×1 IMPLANT
PIN STEINMAN FIXATION KNEE (PIN) IMPLANT
PROTECTOR NERVE ULNAR (MISCELLANEOUS) ×1 IMPLANT
SET HNDPC FAN SPRY TIP SCT (DISPOSABLE) ×1 IMPLANT
SPIKE FLUID TRANSFER (MISCELLANEOUS) ×2 IMPLANT
STRIP CLOSURE SKIN 1/2X4 (GAUZE/BANDAGES/DRESSINGS) IMPLANT
SUT MNCRL AB 3-0 PS2 18 (SUTURE) ×1 IMPLANT
SUT VIC AB 0 CT1 36 (SUTURE) ×1 IMPLANT
SUT VIC AB 1 CT1 36 (SUTURE) ×2 IMPLANT
SUT VIC AB 2-0 CT1 27 (SUTURE) ×1
SUT VIC AB 2-0 CT1 TAPERPNT 27 (SUTURE) ×1 IMPLANT
SYR CONTROL 10ML LL (SYRINGE) ×2 IMPLANT
TRAY CATH INTERMITTENT SS 16FR (CATHETERS) IMPLANT
TUBE SUCTION HIGH CAP CLEAR NV (SUCTIONS) ×1 IMPLANT
WATER STERILE IRR 1000ML POUR (IV SOLUTION) ×2 IMPLANT
WRAP KNEE MAXI GEL POST OP (GAUZE/BANDAGES/DRESSINGS) ×1 IMPLANT

## 2023-02-22 NOTE — H&P (Addendum)
Subjective: Mariah Arnold is admitted for right total knee arthroplasty.  Mariah Arnold is a 57 y.o. female presented with a history of pain in the right knee. Onset of symptoms was gradual starting 2 years ago with gradually worsening course since that time. The Mariah Arnold noted no past surgery on the right knee. Prior procedures on the knee include meniscectomy. Mariah Arnold has been treated conservatively with over-the-counter NSAIDs and activity modification. Mariah Arnold currently rates pain in the knee at 10 out of 10 with activity. There is pain at night.  There are no problems to display for this Mariah Arnold.  Past Medical History:  Diagnosis Date   Anemia    Arthritis    Asthma    Depression    GERD (gastroesophageal reflux disease)    Headache    History of hiatal hernia    History of kidney stones     Past Surgical History:  Procedure Laterality Date   ABDOMINAL HYSTERECTOMY  2001   BUNIONECTOMY Right 2001   BUNIONECTOMY Left 2006   CESAREAN SECTION  1989   MENISCUS REPAIR Right 2021   NOSE SURGERY  1983   ROUX-EN-Y GASTRIC BYPASS  2010   TONSILLECTOMY Bilateral 1983   TUBAL LIGATION  1996    No current facility-administered medications for this encounter.   Current Outpatient Medications  Medication Sig Dispense Refill Last Dose   acetaminophen (TYLENOL) 500 MG tablet Take 1,000 mg by mouth every 6 (six) hours as needed for moderate pain.      ferrous sulfate 325 (65 FE) MG tablet Take 325 mg by mouth 3 (three) times a week.      fluticasone (FLONASE) 50 MCG/ACT nasal spray Place 2 sprays into both nostrils daily as needed for allergies.      loratadine (CLARITIN) 10 MG tablet Take 10 mg by mouth daily.      Multiple Vitamin (MULTIVITAMIN WITH MINERALS) TABS tablet Take 1 tablet by mouth daily.      YUVAFEM 10 MCG TABS vaginal tablet Place 1 tablet vaginally 2 (two) times a week.      Allergies  Allergen Reactions   Doxycycline Nausea And Vomiting   Penicillins Nausea Only   Aspirin  Nausea Only   Sulfa Antibiotics Rash    Social History   Tobacco Use   Smoking status: Never   Smokeless tobacco: Never  Substance Use Topics   Alcohol use: Never    No family history on file.  Review of Systems Pertinent items are noted in HPI.  Objective: Vital signs in last 24 hours:   Vitals:   02/22/23 0937  BP: (!) 146/89  Pulse: 93  Resp: 15  Temp: 98.3 F (36.8 C)  SpO2: 98%       General Appearance:    Alert, cooperative, no distress, appears stated age  Head:    Normocephalic, without obvious abnormality, atraumatic  Eyes:    PERRL, conjunctiva/corneas clear, EOM's intact, fundi    benign, both eyes  Ears:    Normal TM's and external ear canals, both ears  Nose:   Nares normal, septum midline, mucosa normal, no drainage    or sinus tenderness  Throat:   Lips, mucosa, and tongue normal; teeth and gums normal  Neck:   Supple, symmetrical, trachea midline, no adenopathy;    thyroid:  no enlargement/tenderness/nodules; no carotid   bruit or JVD  Back:     Symmetric, no curvature, ROM normal, no CVA tenderness  Lungs:     Clear to auscultation bilaterally, respirations unlabored  Chest Wall:    No tenderness or deformity   Heart:    Regular rate and rhythm, S1 and S2 normal, no murmur, rub   or gallop  Breast Exam:    No tenderness, masses, or nipple abnormality  Abdomen:     Soft, non-tender, bowel sounds active all four quadrants,    no masses, no organomegaly  Genitalia:    Normal female without lesion, discharge or tenderness  Rectal:    Normal tone, normal prostate, no masses or tenderness;   guaiac negative stool  Extremities:   Extremities normal, atraumatic, no cyanosis or edema  Pulses:   2+ and symmetric all extremities  Skin:   Skin color, texture, turgor normal, no rashes or lesions  Lymph nodes:   Cervical, supraclavicular, and axillary nodes normal  Neurologic:   CNII-XII intact, normal strength, sensation and reflexes    throughout  Right  knee: Painful range of motion.  Limited range of motion.  No instability.  Trace effusion.  Labs: Recent Results (from the past 2160 hour(s))  Basic metabolic panel per protocol     Status: None   Collection Time: 02/15/23  2:44 PM  Result Value Ref Range   Sodium 138 135 - 145 mmol/L   Potassium 4.1 3.5 - 5.1 mmol/L   Chloride 103 98 - 111 mmol/L   CO2 26 22 - 32 mmol/L   Glucose, Bld 92 70 - 99 mg/dL    Comment: Glucose reference range applies only to samples taken after fasting for at least 8 hours.   BUN 11 6 - 20 mg/dL   Creatinine, Ser 6.26 0.44 - 1.00 mg/dL   Calcium 9.1 8.9 - 94.8 mg/dL   GFR, Estimated >54 >62 mL/min    Comment: (NOTE) Calculated using the CKD-EPI Creatinine Equation (2021)    Anion gap 9 5 - 15    Comment: Performed at [facility], [address], [place], [place] [address]  CBC per protocol     Status: None   Collection Time: 02/15/23  2:44 PM  Result Value Ref Range   WBC 8.1 4.0 - 10.5 K/uL   RBC 4.87 3.87 - 5.11 MIL/uL   Hemoglobin 13.0 12.0 - 15.0 g/dL   HCT 09.3 81.8 - 29.9 %   MCV 87.3 80.0 - 100.0 fL   MCH 26.7 26.0 - 34.0 pg   MCHC 30.6 30.0 - 36.0 g/dL   RDW 37.1 69.6 - 78.9 %   Platelets 261 150 - 400 K/uL   nRBC 0.0 0.0 - 0.2 %    Comment: Performed at [facility], [address], [place], [place] [address]  Surgical pcr screen     Status: Abnormal   Collection Time: 02/15/23  2:44 PM   Specimen: Nasal Mucosa; Nasal Swab  Result Value Ref Range   MRSA, PCR (A) NEGATIVE    INVALID, UNABLE TO DETERMINE THE PRESENCE OF TARGET DUE TO SPECIMEN INTEGRITY. RECOLLECTION REQUESTED.    Comment: NOTIFIED PADM AT 1253 02/16/23 CRUICKSHANK A   Staphylococcus aureus (A) NEGATIVE    INVALID, UNABLE TO DETERMINE THE PRESENCE OF TARGET DUE TO SPECIMEN INTEGRITY. RECOLLECTION REQUESTED.    Comment: NOTIFIED PADM AT 1253 02/16/23 CRUICKSHANK A Performed at [facility], [address],  [place], [place] [address]      Imaging Review Plain radiographs demonstrate severe degenerative joint disease of the right knee. The overall alignment is neutral. The bone quality appears to be fair for age and  reported activity level.  Assessment/Plan: End stage arthritis, right knee   The Mariah Arnold history, physical examination and imaging studies are consistent with advanced degenerative joint disease of the right knee. The treatment options including medical management, injection therapy arthroscopy and arthroplasty were discussed at length. The risks and benefits of total knee arthroplasty were presented and reviewed. The risks due to aseptic loosening, infection, stiffness, patella tracking problems, thromboembolic complications among others were discussed. The Mariah Arnold acknowledged the explanation, agreed to proceed with the plan and a consent was signed.

## 2023-02-22 NOTE — Transfer of Care (Signed)
Immediate Anesthesia Transfer of Care Note  Patient: Mariah Arnold  Procedure(s) Performed: TOTAL KNEE ARTHROPLASTY (Right: Knee)  Patient Location: PACU  Anesthesia Type:MAC combined with regional for post-op pain  Level of Consciousness: awake and alert   Airway & Oxygen Therapy: Patient Spontanous Breathing and Patient connected to face mask oxygen  Post-op Assessment: Report given to RN and Post -op Vital signs reviewed and stable  Post vital signs: Reviewed and stable  Last Vitals:  Vitals Value Taken Time  BP    Temp    Pulse 89 02/22/23 1358  Resp    SpO2 100 % 02/22/23 1358  Vitals shown include unfiled device data.  Last Pain:  Vitals:   02/22/23 1130  TempSrc:   PainSc: 0-No pain         Complications: No notable events documented.

## 2023-02-22 NOTE — Anesthesia Preprocedure Evaluation (Addendum)
Anesthesia Evaluation  Patient identified by MRN, date of birth, ID band Patient awake    Reviewed: Allergy & Precautions, NPO status , Patient's Chart, lab work & pertinent test results  Airway Mallampati: II  TM Distance: >3 FB Neck ROM: Full    Dental no notable dental hx.    Pulmonary asthma    Pulmonary exam normal breath sounds clear to auscultation       Cardiovascular negative cardio ROS Normal cardiovascular exam Rhythm:Regular Rate:Normal     Neuro/Psych  Headaches PSYCHIATRIC DISORDERS  Depression       GI/Hepatic Neg liver ROS, hiatal hernia,GERD  ,,  Endo/Other  Hypothyroidism    Renal/GU negative Renal ROS     Musculoskeletal  (+) Arthritis ,    Abdominal   Peds  Hematology  (+) Blood dyscrasia, anemia   Anesthesia Other Findings   Reproductive/Obstetrics negative OB ROS                             Anesthesia Physical Anesthesia Plan  ASA: 2  Anesthesia Plan: Spinal   Post-op Pain Management: Tylenol PO (pre-op)* and Regional block*   Induction:   PONV Risk Score and Plan: 3 and Ondansetron, Dexamethasone and Treatment may vary due to age or medical condition  Airway Management Planned: Simple Face Mask  Additional Equipment:   Intra-op Plan:   Post-operative Plan:   Informed Consent: I have reviewed the patients History and Physical, chart, labs and discussed the procedure including the risks, benefits and alternatives for the proposed anesthesia with the patient or authorized representative who has indicated his/her understanding and acceptance.     Dental advisory given  Plan Discussed with: CRNA  Anesthesia Plan Comments:        Anesthesia Quick Evaluation

## 2023-02-22 NOTE — Discharge Instructions (Signed)

## 2023-02-22 NOTE — Progress Notes (Signed)
Orthopedic Tech Progress Note Patient Details:  Mariah Arnold 04-17-1966 [id number]  Ortho Devices Type of Ortho Device: Bone foam zero knee     Verbal order for bone foam received from PACU RN. Darleen Crocker 02/22/2023, 7:26 PM

## 2023-02-22 NOTE — Anesthesia Procedure Notes (Signed)
Spinal  Patient location during procedure: OR Reason for block: surgical anesthesia Staffing Performed: resident/CRNA  Resident/CRNA: [place], Stephanie C, CRNA Performed by: Deri Fuelling, CRNA Authorized by: Lewie Loron, MD   Preanesthetic Checklist Completed: patient identified, IV checked, site marked, risks and benefits discussed, surgical consent, monitors and equipment checked, pre-op evaluation and timeout performed Spinal Block Patient position: sitting Prep: DuraPrep and site prepped and draped Patient monitoring: heart rate, cardiac monitor, continuous pulse ox and blood pressure Approach: midline Location: L3-4 Injection technique: single-shot Needle Needle type: Pencan  Needle gauge: 24 G Needle length: 9 cm Assessment Sensory level: T4 Events: CSF return Additional Notes IV functioning, monitors applied to pt. Expiration date of kit checked and confirmed to be in date. Sterile prep and drape, hand hygiene and sterile gloved used. Pt was positioned and spine was prepped in sterile fashion. Skin was anesthetized with lidocaine. Free flow of clear CSF obtained prior to injecting local anesthetic into CSF x 1 attempt. Spinal needle aspirated freely following injection. Needle was carefully withdrawn, and pt tolerated procedure well. Loss of motor and sensory on exam post injection.

## 2023-02-22 NOTE — Op Note (Signed)
PATIENT ID:      Mariah Arnold  MRN:     [id number] DOB/AGE:    Mar 15, 1966 / 57 y.o.       OPERATIVE REPORT   DATE OF PROCEDURE:  02/22/2023      PREOPERATIVE DIAGNOSIS:   RIGHT KNEE DEGENERATIVE JOINT DISEASE      Estimated body mass index is 35.69 kg/m as calculated from the following:   Height as of this encounter: 5' 1.5" (1.562 m).   Weight as of this encounter: 87.1 kg.                                                       POSTOPERATIVE DIAGNOSIS:   Same                                                                  PROCEDURE:  Procedure(s): TOTAL KNEE ARTHROPLASTY Using DepuyAttune RP implants #4N Femur, #4 Tibia, 8 mm Attune RP bearing, 38 Patella    SURGEON: Harvie Junior  ASSISTANT:  Gus Puma PA-C   (Present and scrubbed throughout the case, critical for assistance with exposure, retraction, instrumentation, and closure.)        ANESTHESIA: spinal, 20cc Exparel, 50cc 0.25% Marcaine EBL: min cc FLUID REPLACEMENT: unk cc crystaloid TOURNIQUET: DRAINS: None TRANEXAMIC ACID: 1gm IV, 2gm topical COMPLICATIONS:  None         INDICATIONS FOR PROCEDURE: The patient has  RIGHT KNEE DEGENERATIVE JOINT DISEASE, mild varus deformities, XR shows bone on bone arthritis, lateral subluxation of tibia. Patient has failed all conservative measures including anti-inflammatory medicines, narcotics, attempts at exercise and weight loss, cortisone injections and viscosupplementation.  Risks and benefits of surgery have been discussed, questions answered.   DESCRIPTION OF PROCEDURE: The patient identified by armband, received  IV antibiotics, in the holding area at [facility]. Patient taken to the operating room, appropriate anesthetic monitors were attached, and spinal anesthesia was  induced. IV Tranexamic acid was given. Lateral post and 2 surefoot positioners applied to the table, the lower extremity was then prepped and draped in usual sterile fashion from the toes to the  high thigh. Time-out procedure was performed. Gus Puma PAC, was present and scrubbed throughout the case, critical for assistance with, positioning, exposure, retraction, instrumentation, and closure.The skin and subcutaneous tissue along the incision was injected with 20 cc of a mixture of 20cc Exparel and 30cc Marcaine 50cc saline solution, using a 21-gauge by 1-1/2 inch needle. We began the operation, with the knee flexed 130 degrees, by making the anterior midline incision starting at handbreadth above the patella going over the patella 1 cm medial to and 4 cm distal to the tibial tubercle. Small bleeders in the skin and the subcutaneous tissue identified and cauterized. Transverse retinaculum was incised and reflected medially and a medial parapatellar arthrotomy was accomplished. the patella was everted and theprepatellar fat pad resected. The superficial medial collateral ligament was then elevated from anterior to posterior along the proximal flare of the tibia and anterior half of the menisci resected. The knee was hyperflexed exposing bone on bone  arthritis. Peripheral and notch osteophytes as well as the cruciate ligaments were then resected. We continued to work our way around posteriorly along the proximal tibia, and externally rotated the tibia subluxing it out from underneath the femur. A McHale PCL retractor was placed through the notch, a lateral Hohmann retractor, and anterolateral small homan retractor placed. We then entered the proximal tibia with the Depuy starter drill in line with the axis of the tibia followed by an intramedullary guide rod and 3-degree posterior slope cutting guide. The tibial cutting guide, was pinned into place allowing resection of 2 mm of bone medially and 10 mm of bone laterally. Satisfied with the tibial resection, we then entered the distal femur 2 mm anterior to the PCL origin with the starter drill, followed by the intramedullary guide rod and applied the distal  femoral cutting guide set at 9 mm, with 5 degrees of valgus. This was pinned along the epicondylar axis. At this point, the distal femoral cut was accomplished without difficulty. We then sized for a #4N femoral component and pinned the chamfer guide in 3 degrees of external rotation. The anterior, posterior, and chamfer cuts were accomplished without difficulty followed by the Attune RP box cutting guide and the box cut. We also removed posterior osteophytes from the posterior femoral condyles. The posterior capsule was injected with Exparel solution. The knee was brought into full extension. We checked our extension gap and fit a 8 mm trial lollipop. Distracting in extension with a lamina spreader,  bleeders in the posterior capsule, Posterior medial and posterior lateral gutter were cauterized.  The transexamic acid-soaked sponge was then placed in the gap of the knee in extension. The knee was flexed 30. The posterior patella cut was accomplished with the 9.5 mm Attune cutting guide, sized for a 38 mm dome, and the fixation pegs drilled.The knee was then once again hyperflexed exposing the proximal tibia. We sized for a # 4 tibial base plate, applied the smokestack and the conical reamer followed by the the Delta fin keel punch. We then hammered into place the Attune RP trial femoral component, drilled the lugs, inserted a  8 mm trial bearing, trial patellar button, and took the knee through range of motion from 0-130 degrees. Medial and lateral ligamentous stability was checked. No thumb pressure was required for patellar Tracking.  All trial components were removed, mating surfaces irrigated with pulse lavage, and dried with suction and sponges. 10 cc of the Exparel solution was applied to the cancellus bone of the patella distal femur and proximal tibia.  After waiting 30 seconds, the bony surfaces were again, dried with sponges. A double batch of DePuy HV cement was mixed and applied to all bony metallic  mating surfaces except for the posterior condyles of the femur itself. In order, we hammered into place the tibial tray and removed excess cement, the femoral component and removed excess cement. The final Attune RP bearing was inserted, and the knee brought to full extension with compression. The patellar button was clamped into place, and excess cement removed. The knee was held at 30 flexion with compression using the second surefoot, while the cement cured. The wound was irrigated out with normal saline solution pulse lavage. The rest of the Exparel was injected into the parapatellar arthrotomy, subcutaneous tissues, and periosteal tissues. The parapatellar arthrotomy was closed with running #1 Vicryl suture. The subcutaneous tissue with 3-0 undyed Vicryl suture, and the skin with running 3-0 SQ vicryl. An Aquacil dressing and  Ace wrap were applied. The patient was taken to recovery room without difficulty.   Harvie Junior 02/22/2023, 4:03 PM

## 2023-02-22 NOTE — Evaluation (Signed)
Physical Therapy Evaluation Patient Details Name: Mariah Arnold MRN: [id number] DOB: [birth year] Today's Date: 02/22/2023  History of Present Illness  57 yo female presents to therapy s/p R TKA on 02/22/2023 due to failure of conservative measures. Pt PMH includes but is not limited to: anemia, asthma, depression, GERD, kidney stones, and hiatal hernia.  Clinical Impression     Mariah Arnold is a 57 y.o. female POD 0 s/p R TKA. Patient reports IND with mobility at baseline. Patient is now limited by functional impairments (see PT problem list below) and requires CGA for transfers and gait with RW. Patient was able to ambulate 40 and 25 feet with RW and CGA and cues for safe walker management. Patient educated on safe sequencing for stair mobility with use of RW, fall risk prevention, pain management and goal, use of CP/ice, car transfers pt and spouse verbalized understanding of safe guarding position for people assisting with mobility. Patient instructed in exercises to facilitate ROM and circulation reviewed and HO provided. Patient will benefit from continued skilled PT interventions to address impairments and progress towards PLOF. Patient has met mobility goals at adequate level for discharge home with family support and OPPT services scheduled for 9/25; will continue to follow if pt continues acute stay to progress towards Mod I goals.       If plan is discharge home, recommend the following: A little help with walking and/or transfers;A little help with bathing/dressing/bathroom;Assistance with cooking/housework;Help with stairs or ramp for entrance;Assist for transportation   Can travel by private vehicle        Equipment Recommendations Rolling walker (2 wheels) (provided and adjusted at eval)  Recommendations for Other Services       Functional Status Assessment Patient has had a recent decline in their functional status and demonstrates the ability to make significant improvements in  function in a reasonable and predictable amount of time.     Precautions / Restrictions Precautions Precautions: Knee;Fall Restrictions Weight Bearing Restrictions: No      Mobility  Bed Mobility Overal bed mobility: Needs Assistance Bed Mobility: Supine to Sit, Rolling Rolling: Min assist, Used rails   Supine to sit: Contact guard, HOB elevated     General bed mobility comments: min cues for bed mobiltiy tasks    Transfers Overall transfer level: Needs assistance Equipment used: Rolling walker (2 wheels) Transfers: Sit to/from Stand Sit to Stand: Contact guard assist, From elevated surface           General transfer comment: min ceus    Ambulation/Gait Ambulation/Gait assistance: Contact guard assist Gait Distance (Feet): 40 Feet Assistive device: Rolling walker (2 wheels) Gait Pattern/deviations: Step-to pattern, Antalgic, Trunk flexed Gait velocity: decreased     General Gait Details: min cues for safety and sequencing with gait as well as maintaining RW on floor  Stairs Stairs: Yes Stairs assistance: Contact guard assist Stair Management: Two rails Number of Stairs: 3 General stair comments: cues for proper technique sequencing and safety. pt has one small step to enter home and pt instruction provided on use of RW  Wheelchair Mobility     Tilt Bed    Modified Rankin (Stroke Patients Only)       Balance Overall balance assessment: Needs assistance Sitting-balance support: Feet supported Sitting balance-Leahy Scale: Good     Standing balance support: Bilateral upper extremity supported, During functional activity, Reliant on assistive device for balance Standing balance-Leahy Scale: Fair Standing balance comment: static standing no UE support  Pertinent Vitals/Pain Pain Assessment Pain Assessment: 0-10 Pain Score: 2  Pain Location: R knee and leg/thigh Pain Descriptors / Indicators: Sore,  Discomfort, Operative site guarding, Throbbing Pain Intervention(s): Limited activity within patient's tolerance, Monitored during session, Premedicated before session, Repositioned, Ice applied    Home Living Family/patient expects to be discharged to:: Private residence Living Arrangements: Spouse/significant other Available Help at Discharge: Family Type of Home: House Home Access: Stairs to enter Entrance Stairs-Rails: None Entrance Stairs-Number of Steps: 1   Home Layout: One level Home Equipment: Cane - single point;Crutches;Rollator (4 wheels)      Prior Function Prior Level of Function : Independent/Modified Independent;Driving;Working/employed             Mobility Comments: IND with all ADLs, self care tasks, IADLs, no AD       Extremity/Trunk Assessment        Lower Extremity Assessment Lower Extremity Assessment: RLE deficits/detail RLE Deficits / Details: ankle DF/PF 5/5; SLR < 10 degree lag RLE Sensation: decreased light touch (R heel)    Cervical / Trunk Assessment Cervical / Trunk Assessment: Normal  Communication   Communication Communication: No apparent difficulties  Cognition Arousal: Alert Behavior During Therapy: WFL for tasks assessed/performed Overall Cognitive Status: Within Functional Limits for tasks assessed                                          General Comments      Exercises Total Joint Exercises Ankle Circles/Pumps: AROM, Both, 15 reps Quad Sets: AROM, Right, 5 reps Short Arc Quad: AROM, Right, 5 reps Heel Slides: AROM, Right, 5 reps Hip ABduction/ADduction: AROM, Right, 5 reps Straight Leg Raises: AROM, Right, 5 reps Knee Flexion: AROM, Right, 5 reps, Seated   Assessment/Plan    PT Assessment Patient needs continued PT services  PT Problem List Decreased strength;Decreased range of motion;Decreased activity tolerance;Decreased balance;Decreased mobility;Decreased coordination;Pain       PT  Treatment Interventions DME instruction;Gait training;Stair training;Functional mobility training;Therapeutic activities;Therapeutic exercise;Balance training;Neuromuscular re-education;Patient/family education;Modalities    PT Goals (Current goals can be found in the Care Plan section)  Acute Rehab PT Goals Patient Stated Goal: to be able to work around the house comfortably and walk without pain PT Goal Formulation: With patient Time For Goal Achievement: 03/08/23 Potential to Achieve Goals: Good    Frequency 7X/week     Co-evaluation               AM-PAC PT "6 Clicks" Mobility  Outcome Measure Help needed turning from your back to your side while in a flat bed without using bedrails?: A Little Help needed moving from lying on your back to sitting on the side of a flat bed without using bedrails?: A Little Help needed moving to and from a bed to a chair (including a wheelchair)?: A Little Help needed standing up from a chair using your arms (e.g., wheelchair or bedside chair)?: A Little Help needed to walk in hospital room?: A Little Help needed climbing 3-5 steps with a railing? : A Little 6 Click Score: 18    End of Session Equipment Utilized During Treatment: Gait belt Activity Tolerance: Patient tolerated treatment well;No increased pain Patient left: in chair;with call bell/phone within reach;with family/visitor present Nurse Communication: Mobility status;Other (comment) (pt readiness for d/c from PT standpoint) PT Visit Diagnosis: Unsteadiness on feet (R26.81);Other abnormalities of gait and mobility (R26.89);Muscle weakness (  generalized) (M62.81);Difficulty in walking, not elsewhere classified (R26.2);Pain Pain - Right/Left: Right Pain - part of body: Knee;Leg    Time: 8413-2440 PT Time Calculation (min) (ACUTE ONLY): 40 min   Charges:   PT Evaluation $PT Eval Low Complexity: 1 Low PT Treatments $Gait Training: 8-22 mins $Therapeutic Exercise: 8-22 mins PT  General Charges $$ ACUTE PT VISIT: 1 Visit         Johnny Bridge, PT Acute Rehab   Jacqualyn Posey 02/22/2023, 6:31 PM

## 2023-02-22 NOTE — Anesthesia Procedure Notes (Signed)
Anesthesia Regional Block: Adductor canal block   Pre-Anesthetic Checklist: , timeout performed,  Correct Patient, Correct Site, Correct Laterality,  Correct Procedure, Correct Position, site marked,  Risks and benefits discussed,  Surgical consent,  Pre-op evaluation,  At surgeon's request and post-op pain management  Laterality: Lower and Right  Prep: chloraprep       Needles:  Injection technique: Single-shot  Needle Type: Stimiplex     Needle Length: 9cm  Needle Gauge: 21     Additional Needles:   Procedures:,,,, ultrasound used (permanent image in chart),,    Narrative:  Start time: 02/22/2023 9:54 AM End time: 02/22/2023 10:14 AM Injection made incrementally with aspirations every 5 mL.  Performed by: Personally  Anesthesiologist: Lewie Loron, MD  Additional Notes: BP cuff, EKG monitors applied. Sedation begun. Artery and nerve location verified with ultrasound. Anesthetic injected incrementally (5ml), slowly, and after negative aspirations under direct u/s guidance. Good fascial/perineural spread. Tolerated well.

## 2023-02-23 ENCOUNTER — Encounter (HOSPITAL_COMMUNITY): Payer: Self-pay | Admitting: Orthopedic Surgery

## 2023-02-23 NOTE — Anesthesia Postprocedure Evaluation (Signed)
Anesthesia Post Note  Patient: Mariah Arnold  Procedure(s) Performed: TOTAL KNEE ARTHROPLASTY (Right: Knee)     Patient location during evaluation: PACU Anesthesia Type: Spinal Level of consciousness: sedated and patient cooperative Pain management: pain level controlled Vital Signs Assessment: post-procedure vital signs reviewed and stable Respiratory status: spontaneous breathing Cardiovascular status: stable Anesthetic complications: no   No notable events documented.  Last Vitals:  Vitals:   02/22/23 1700 02/22/23 1800  BP: 102/67 130/85  Pulse: 77 82  Resp: 14   Temp:  36.7 C  SpO2: 100% 97%    Last Pain:  Vitals:   02/22/23 1600  TempSrc:   PainSc: 0-No pain                 Lewie Loron
